# Patient Record
Sex: Male | Born: 1995 | Race: White | Hispanic: No | Marital: Single | Smoking: Current some day smoker
Health system: Southern US, Community
[De-identification: ages and names within clinical notes are randomized; demographics above are authoritative.]

## PROBLEM LIST (undated history)

## (undated) DIAGNOSIS — F909 Attention-deficit hyperactivity disorder, unspecified type: Secondary | ICD-10-CM

## (undated) DIAGNOSIS — S060X9A Concussion with loss of consciousness of unspecified duration, initial encounter: Secondary | ICD-10-CM

## (undated) DIAGNOSIS — S060XAA Concussion with loss of consciousness status unknown, initial encounter: Secondary | ICD-10-CM

## (undated) DIAGNOSIS — B019 Varicella without complication: Secondary | ICD-10-CM

## (undated) DIAGNOSIS — T7840XA Allergy, unspecified, initial encounter: Secondary | ICD-10-CM

## (undated) DIAGNOSIS — J45909 Unspecified asthma, uncomplicated: Secondary | ICD-10-CM

## (undated) HISTORY — DX: Varicella without complication: B01.9

## (undated) HISTORY — DX: Unspecified asthma, uncomplicated: J45.909

## (undated) HISTORY — DX: Allergy, unspecified, initial encounter: T78.40XA

## (undated) HISTORY — DX: Concussion with loss of consciousness of unspecified duration, initial encounter: S06.0X9A

## (undated) HISTORY — DX: Concussion with loss of consciousness status unknown, initial encounter: S06.0XAA

## (undated) HISTORY — PX: MOUTH SURGERY: SHX715

## (undated) HISTORY — PX: FRACTURE SURGERY: SHX138

## (undated) HISTORY — PX: SHOULDER ARTHROSCOPY W/ LABRAL REPAIR: SHX2399

---
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
## 2015-09-21 ENCOUNTER — Ambulatory Visit (INDEPENDENT_AMBULATORY_CARE_PROVIDER_SITE_OTHER): Payer: Worker's Compensation | Admitting: Family Medicine

## 2015-09-21 VITALS — BP 120/78 | HR 87 | Temp 97.9°F | Resp 18 | Ht 71.0 in | Wt 158.6 lb

## 2015-09-21 DIAGNOSIS — S61210A Laceration without foreign body of right index finger without damage to nail, initial encounter: Secondary | ICD-10-CM

## 2015-09-21 DIAGNOSIS — Z23 Encounter for immunization: Secondary | ICD-10-CM

## 2015-09-21 NOTE — Progress Notes (Signed)
   Subjective:  This chart was scribed for Norberto SorensonEva Kamylah Manzo, MD by Stann Oresung-Kai Tsai, Medical Scribe. This patient was seen in Room 5 and the patient's care was started 4:18 PM.   Patient ID: Tyler Berry, male    DOB: 09/01/1996, 19 y.o.   MRN: [id number] Chief Complaint  Patient presents with  . other    cut right finger   HPI Tyler Berry is a 19 y.o. male who [facility] complaining of sudden onset laceration over right index finger by knife edge occurred today around 11:30AM with worker's comp. After the cut, he continued work after wrapping the cut with a band. He believes he is due for tetanus shot.   Allergies  Allergen Reactions  . Sulfa Antibiotics Hives   Review of Systems  Constitutional: Negative for fever, chills, diaphoresis and fatigue.  Respiratory: Negative for cough and shortness of breath.   Gastrointestinal: Negative for nausea, vomiting, diarrhea and constipation.  Skin: Positive for wound (right index finger). Negative for rash.  Neurological: Negative for dizziness and headaches.       Objective:   Physical Exam  Constitutional: He is oriented to person, place, and time. He appears well-developed and well-nourished. No distress.  HENT:  Head: Normocephalic and atraumatic.  Eyes: EOM are normal. Pupils are equal, round, and reactive to light.  Neck: Neck supple.  Cardiovascular: Normal rate.   Pulmonary/Chest: Effort normal. No respiratory distress.  Musculoskeletal: Normal range of motion.  Neurological: He is alert and oriented to person, place, and time.  Skin: Skin is warm and dry.  1cm laceration over dorsum of 2nd finger proximal to PIP  Psychiatric: He has a normal mood and affect. His behavior is normal.  Nursing note and vitals reviewed.   BP 120/78 mmHg  Pulse 87  Temp(Src) 97.9 F (36.6 C) (Oral)  Resp 18  Ht 5\' 11"  (1.803 m)  Wt 158 lb 9.6 oz (71.94 kg)  BMI 22.13 kg/m2  SpO2 98%     Assessment & Plan:   1. Laceration of second  finger, right, initial encounter   Washed well with soap and water, tissue adhesive applied, pt tolerated well.  Advised to avoid getting hands wet freq, do not soak. Wear gloves at work. Can wear fold-over aluminum U-splint to minimize trauma to the laceration to avoid dehiscence.  RTC prn.   Orders Placed This Encounter  Procedures  . Tdap vaccine greater than or equal to 7yo IM    I personally performed the services described in this documentation, which was scribed in my presence. The recorded information has been reviewed and considered, and addended by me as needed.  Norberto SorensonEva Makaya Juneau, MD MPH    By signing my name below, I, Stann Oresung-Kai Tsai, attest that this documentation has been prepared under the direction and in the presence of Norberto SorensonEva Deauna Yaw, MD. Electronically Signed: Stann Oresung-Kai Tsai, Scribe. 09/21/2015 , 4:20 PM .

## 2015-09-21 NOTE — Patient Instructions (Addendum)
Tissue Adhesive Wound Care Some cuts, wounds, lacerations, and incisions can be repaired by using tissue adhesive. Tissue adhesive is like glue. It holds the skin together, allowing for faster healing. It forms a strong bond on the skin in about 1 minute and reaches its full strength in about 2 or 3 minutes. The adhesive disappears naturally while the wound is healing. It is important to take proper care of your wound at home while it heals.  HOME CARE INSTRUCTIONS   Showers are allowed. Do not soak the area containing the tissue adhesive. Do not take baths, swim, or use hot tubs. Do not use any soaps or ointments on the wound. Certain ointments can weaken the glue.  If a bandage (dressing) has been applied, follow your health care provider's instructions for how often to change the dressing.   Keep the dressing dry if one has been applied.   Do not scratch, pick, or rub the adhesive.   Do not place tape over the adhesive. The adhesive could come off when pulling the tape off.   Protect the wound from further injury until it is healed.   Protect the wound from sun and tanning bed exposure while it is healing and for several weeks after healing.   Only take over-the-counter or prescription medicines as directed by your health care provider.   Keep all follow-up appointments as directed by your health care provider. SEEK IMMEDIATE MEDICAL CARE IF:   Your wound becomes red, swollen, hot, or tender.   You develop a rash after the glue is applied.  You have increasing pain in the wound.   You have a red streak that goes away from the wound.   You have pus coming from the wound.   You have increased bleeding.  You have a fever.  You have shaking chills.   You notice a bad smell coming from the wound.   Your wound or adhesive breaks open.  MAKE SURE YOU:   Understand these instructions.  Will watch your condition.  Will get help right away if you are not doing  well or get worse.   This information is not intended to replace advice given to you by your health care provider. Make sure you discuss any questions you have with your health care provider.   Document Released: 03/30/2001 Document Revised: 07/25/2013 Document Reviewed: 04/25/2013 Elsevier Interactive Patient Education 2016 [employer].  Tdap Vaccine (Tetanus, Diphtheria and Pertussis): What You Need to Know 1. Why get vaccinated? Tetanus, diphtheria and pertussis are very serious diseases. Tdap vaccine can protect [place] from these diseases. And, Tdap vaccine given to pregnant women can protect newborn babies against pertussis. TETANUS (Lockjaw) is rare in the [place] today. It causes painful muscle tightening and stiffness, usually all over the body.  It can lead to tightening of muscles in the head and neck so you can't open your mouth, swallow, or sometimes even breathe. Tetanus kills about 1 out of 10 people who are infected even after receiving the best medical care. DIPHTHERIA is also rare in the [place] today. It can cause a thick coating to form in the back of the throat.  It can lead to breathing problems, heart failure, paralysis, and death. PERTUSSIS (Whooping Cough) causes severe coughing spells, which can cause difficulty breathing, vomiting and disturbed sleep.  It can also lead to weight loss, incontinence, and rib fractures. Up to 2 in 100 adolescents and 5 in 100 adults with pertussis are hospitalized or  have complications, which could include pneumonia or death. These diseases are caused by bacteria. Diphtheria and pertussis are spread from person to person through secretions from coughing or sneezing. Tetanus enters the body through cuts, scratches, or wounds. Before vaccines, as many as 200,000 cases of diphtheria, 200,000 cases of pertussis, and hundreds of cases of tetanus, were reported in the [place] each year. Since vaccination began, reports of cases  for tetanus and diphtheria have dropped by about 99% and for pertussis by about 80%. 2. Tdap vaccine Tdap vaccine can protect adolescents and adults from tetanus, diphtheria, and pertussis. One dose of Tdap is routinely given at age 52 or 50. People who did not get Tdap at that age should get it as soon as possible. Tdap is especially important for healthcare professionals and anyone having close contact with a baby younger than 12 months. Pregnant women should get a dose of Tdap during every pregnancy, to protect the newborn from pertussis. Infants are most at risk for severe, life-threatening complications from pertussis. Another vaccine, called Td, protects against tetanus and diphtheria, but not pertussis. A Td booster should be given every 10 years. Tdap may be given as one of these boosters if you have never gotten Tdap before. Tdap may also be given after a severe cut or burn to prevent tetanus infection. Your doctor or the person giving you the vaccine can give you more information. Tdap may safely be given at the same time as other vaccines. 3. Some people should not get this vaccine  A person who has ever had a life-threatening allergic reaction after a previous dose of any diphtheria, tetanus or pertussis containing vaccine, OR has a severe allergy to any part of this vaccine, should not get Tdap vaccine. Tell the person giving the vaccine about any severe allergies.  Anyone who had coma or long repeated seizures within 7 days after a childhood dose of DTP or DTaP, or a previous dose of Tdap, should not get Tdap, unless a cause other than the vaccine was found. They can still get Td.  Talk to your doctor if you:  have seizures or another nervous system problem,  had severe pain or swelling after any vaccine containing diphtheria, tetanus or pertussis,  ever had a condition called Guillain-Barr Syndrome (GBS),  aren't feeling well on the day the shot is scheduled. 4. Risks With any  medicine, including vaccines, there is a chance of side effects. These are usually mild and go away on their own. Serious reactions are also possible but are rare. Most people who get Tdap vaccine do not have any problems with it. Mild problems following Tdap (Did not interfere with activities)  Pain where the shot was given (about 3 in 4 adolescents or 2 in 3 adults)  Redness or swelling where the shot was given (about 1 person in 5)  Mild fever of at least 100.45F (up to about 1 in 25 adolescents or 1 in 100 adults)  Headache (about 3 or 4 people in 10)  Tiredness (about 1 person in 3 or 4)  Nausea, vomiting, diarrhea, stomach ache (up to 1 in 4 adolescents or 1 in 10 adults)  Chills, sore joints (about 1 person in 10)  Body aches (about 1 person in 3 or 4)  Rash, swollen glands (uncommon) Moderate problems following Tdap (Interfered with activities, but did not require medical attention)  Pain where the shot was given (up to 1 in 5 or 6)  Redness or swelling  where the shot was given (up to about 1 in 16 adolescents or 1 in 12 adults)  Fever over 102F (about 1 in 100 adolescents or 1 in 250 adults)  Headache (about 1 in 7 adolescents or 1 in 10 adults)  Nausea, vomiting, diarrhea, stomach ache (up to 1 or 3 people in 100)  Swelling of the entire arm where the shot was given (up to about 1 in 500). Severe problems following Tdap (Unable to perform usual activities; required medical attention)  Swelling, severe pain, bleeding and redness in the arm where the shot was given (rare). Problems that could happen after any vaccine:  People sometimes faint after a medical procedure, including vaccination. Sitting or lying down for about 15 minutes can help prevent fainting, and injuries caused by a fall. Tell your doctor if you feel dizzy, or have vision changes or ringing in the ears.  Some people get severe pain in the shoulder and have difficulty moving the arm where a shot  was given. This happens very rarely.  Any medication can cause a severe allergic reaction. Such reactions from a vaccine are very rare, estimated at fewer than 1 in a million doses, and would happen within a few minutes to a few hours after the vaccination. As with any medicine, there is a very remote chance of a vaccine causing a serious injury or death. The safety of vaccines is always being monitored. For more information, visit: http://floyd.org/www.cdc.gov/vaccinesafety/ 5. What if there is a serious problem? What should I look for?  Look for anything that concerns you, such as signs of a severe allergic reaction, very high fever, or unusual behavior.  Signs of a severe allergic reaction can include hives, swelling of the face and throat, difficulty breathing, a fast heartbeat, dizziness, and weakness. These would usually start a few minutes to a few hours after the vaccination. What should I do?  If you think it is a severe allergic reaction or other emergency that can't wait, call 9-1-1 or get the person to the nearest hospital. Otherwise, call your doctor.  Afterward, the reaction should be reported to the Vaccine Adverse Event Reporting System (VAERS). Your doctor might file this report, or you can do it yourself through the VAERS web site at www.vaers.LAgents.nohhs.gov, or by calling 1-(814)132-9415. VAERS does not give medical advice.  6. The National Vaccine Injury Compensation Program The [employer] Vaccine Injury Compensation Program (VICP) is a federal program that was created to compensate people who may have been injured by certain vaccines. Persons who believe they may have been injured by a vaccine can learn about the program and about filing a claim by calling 1-(747)690-1575 or visiting the VICP website at SpiritualWord.atwww.hrsa.gov/vaccinecompensation. There is a time limit to file a claim for compensation. 7. How can I learn more?  Ask your doctor. He or she can give you the vaccine package insert or suggest other  sources of information.  Call your local or state health department.  Contact the Centers for Disease Control and Prevention (CDC):  Call 940-694-96151-220 667 2115 (1-800-CDC-INFO) or  Visit CDC's website at PicCapture.uywww.cdc.gov/vaccines CDC Tdap Vaccine VIS (12/11/13)   This information is not intended to replace advice given to you by your health care provider. Make sure you discuss any questions you have with your health care provider.   Document Released: 04/04/2012 Document Revised: 10/25/2014 Document Reviewed: 01/16/2014 Elsevier Interactive Patient Education [employer]2016 Elsevier Inc.

## 2016-07-08 ENCOUNTER — Other Ambulatory Visit: Payer: Self-pay | Admitting: Family Medicine

## 2016-07-08 DIAGNOSIS — R229 Localized swelling, mass and lump, unspecified: Principal | ICD-10-CM

## 2016-07-08 DIAGNOSIS — IMO0002 Reserved for concepts with insufficient information to code with codable children: Secondary | ICD-10-CM

## 2016-07-12 ENCOUNTER — Ambulatory Visit
Admission: RE | Admit: 2016-07-12 | Discharge: 2016-07-12 | Disposition: A | Discharge status: Home / Self Care | Payer: BLUE CROSS/BLUE SHIELD | Source: Ambulatory Visit | Attending: Family Medicine | Admitting: Family Medicine

## 2016-07-12 ENCOUNTER — Other Ambulatory Visit: Payer: Self-pay

## 2016-07-12 DIAGNOSIS — IMO0002 Reserved for concepts with insufficient information to code with codable children: Secondary | ICD-10-CM

## 2016-07-12 DIAGNOSIS — R229 Localized swelling, mass and lump, unspecified: Principal | ICD-10-CM

## 2017-02-22 DIAGNOSIS — M545 Low back pain: Secondary | ICD-10-CM | POA: Diagnosis not present

## 2017-03-01 DIAGNOSIS — M545 Low back pain: Secondary | ICD-10-CM | POA: Diagnosis not present

## 2017-03-09 DIAGNOSIS — G8929 Other chronic pain: Secondary | ICD-10-CM | POA: Diagnosis not present

## 2017-03-09 DIAGNOSIS — M545 Low back pain: Secondary | ICD-10-CM | POA: Diagnosis not present

## 2017-03-16 DIAGNOSIS — G8929 Other chronic pain: Secondary | ICD-10-CM | POA: Diagnosis not present

## 2017-03-16 DIAGNOSIS — M545 Low back pain: Secondary | ICD-10-CM | POA: Diagnosis not present

## 2017-03-23 DIAGNOSIS — G8929 Other chronic pain: Secondary | ICD-10-CM | POA: Diagnosis not present

## 2017-03-23 DIAGNOSIS — M545 Low back pain: Secondary | ICD-10-CM | POA: Diagnosis not present

## 2017-03-30 DIAGNOSIS — M545 Low back pain: Secondary | ICD-10-CM | POA: Diagnosis not present

## 2017-03-30 DIAGNOSIS — G8929 Other chronic pain: Secondary | ICD-10-CM | POA: Diagnosis not present

## 2017-04-06 DIAGNOSIS — G8929 Other chronic pain: Secondary | ICD-10-CM | POA: Diagnosis not present

## 2017-04-06 DIAGNOSIS — M545 Low back pain: Secondary | ICD-10-CM | POA: Diagnosis not present

## 2017-06-29 ENCOUNTER — Encounter: Payer: Self-pay | Admitting: Adult Health

## 2017-06-29 ENCOUNTER — Other Ambulatory Visit (HOSPITAL_COMMUNITY)
Admission: RE | Admit: 2017-06-29 | Discharge: 2017-06-29 | Disposition: A | Discharge status: Home / Self Care | Payer: BLUE CROSS/BLUE SHIELD | Source: Ambulatory Visit | Attending: Adult Health | Admitting: Adult Health

## 2017-06-29 ENCOUNTER — Ambulatory Visit (INDEPENDENT_AMBULATORY_CARE_PROVIDER_SITE_OTHER): Payer: BLUE CROSS/BLUE SHIELD | Admitting: Adult Health

## 2017-06-29 VITALS — BP 122/84 | HR 105 | Temp 98.7°F | Ht 71.0 in | Wt 145.6 lb

## 2017-06-29 DIAGNOSIS — Z113 Encounter for screening for infections with a predominantly sexual mode of transmission: Secondary | ICD-10-CM | POA: Diagnosis not present

## 2017-06-29 DIAGNOSIS — Z23 Encounter for immunization: Secondary | ICD-10-CM

## 2017-06-29 DIAGNOSIS — L309 Dermatitis, unspecified: Secondary | ICD-10-CM | POA: Diagnosis not present

## 2017-06-29 MED ORDER — TRIAMCINOLONE ACETONIDE 0.5 % EX OINT: 1.0000 "application " | TOPICAL_OINTMENT | Freq: Two times a day (BID) | CUTANEOUS | 6 refills | Status: DC

## 2017-06-29 NOTE — Progress Notes (Signed)
    Patient presents to clinic today to establish care. He is a pleasant 21 year old male who  has a past medical history of ADHD (attention deficit hyperactivity disorder); Allergy; Asthma; and Concussion.      Acute Concerns: Establish Care / STD screen. He denies any suspicion of STD but he would like to be checked   Chronic Issues: Eczema - has been treated with steroid creams in the past. He has been "breaking out" on his fingers and hands.  Health Maintenance: Dental -- Routine  Vision -- Routine  Immunizations -- UTD  Colonoscopy -- Never had    Past Medical History:  Diagnosis Date  . ADHD (attention deficit hyperactivity disorder)     No past surgical history on file.  No current outpatient prescriptions on file prior to visit.   No current facility-administered medications on file prior to visit.     Allergies  Allergen Reactions  . Sulfa Antibiotics Hives    No family history on file.  Social History   Social History  . Marital status: Single    Spouse name: N/A  . Number of children: N/A  . Years of education: N/A   Occupational History  . Not on file.   Social History Main Topics  . Smoking status: Never Smoker  . Smokeless tobacco: Not on file  . Alcohol use No  . Drug use: Yes    Types: Marijuana  . Sexual activity: Yes   Other Topics Concern  . Not on file   Social History Narrative  . No narrative on file    Review of Systems  Constitutional: Negative.   Respiratory: Negative.   Cardiovascular: Negative.   Genitourinary: Negative.   Neurological: Negative.   All other systems reviewed and are negative.   BP 122/84   Pulse (!) 105   Temp 98.7 F (37.1 C) (Oral)   Ht 5\' 11"  (1.803 m)   Wt 145 lb 9.6 oz (66 kg)   SpO2 96%   BMI 20.31 kg/m   Physical Exam  Constitutional: He is oriented to person, place, and time and well-developed, well-nourished, and in no distress. No distress.  Cardiovascular: Normal rate, regular  rhythm, normal heart sounds and intact distal pulses.  Exam reveals no gallop and no friction rub.   No murmur heard. Pulmonary/Chest: Effort normal and breath sounds normal. No respiratory distress. He has no wheezes. He has no rales. He exhibits no tenderness.  Abdominal: Soft. Bowel sounds are normal. He exhibits no distension and no mass. There is no tenderness. There is no rebound and no guarding.  Musculoskeletal: Normal range of motion. He exhibits no edema, tenderness or deformity.  Neurological: He is alert and oriented to person, place, and time. Gait normal. GCS score is 15.  Skin: Skin is warm and dry. No rash noted. He is not diaphoretic. No erythema. No pallor.  Mild case of eczema on bilateral hands.   Psychiatric: Memory, affect and judgment normal.  Nursing note and vitals reviewed.  Assessment/Plan: 1. Screen for STD (sexually transmitted disease) - HIV antibody - RPR - Urine cytology ancillary only  2. Need for vaccination  - Flu Vaccine QUAD 6+ mos PF IM (Fluarix Quad PF)  3. Eczema, unspecified type  - triamcinolone ointment (KENALOG) 0.5 %; Apply 1 application topically 2 (two) times daily.  Dispense: 30 g; Refill: 6  Follow up in 1-2 years or as needed   Shirline Freesory [place], NP

## 2017-06-29 NOTE — Patient Instructions (Signed)
It was great meeting you today   I will follow up with you regarding your labs.   Follow up with me in 1-2 years or sooner if needed

## 2017-06-30 LAB — RPR: RPR Ser Ql: NONREACTIVE

## 2017-06-30 LAB — HIV ANTIBODY (ROUTINE TESTING W REFLEX): HIV 1&2 Ab, 4th Generation: NONREACTIVE

## 2017-07-01 LAB — URINE CYTOLOGY ANCILLARY ONLY
Chlamydia: NEGATIVE
NEISSERIA GONORRHEA: NEGATIVE
Trichomonas: NEGATIVE

## 2017-07-08 ENCOUNTER — Encounter: Payer: Self-pay | Admitting: Adult Health

## 2018-04-08 IMAGING — US US SCROTUM
1 series · 14 of 25 positions shown · non-contrast
Comparison: None.

CLINICAL DATA: Left-sided palpable lump for 2 weeks.  Painless.

EXAM:
SCROTAL ULTRASOUND
DOPPLER ULTRASOUND OF THE TESTICLES
TECHNIQUE: Complete ultrasound examination of the testicles, epididymis, and
other scrotal structures was performed. Color and spectral Doppler
ultrasound were also utilized to evaluate blood flow to the
testicles.

[Series 1: us scrotum · 14 of 49 slices shown]
[im 1/49]
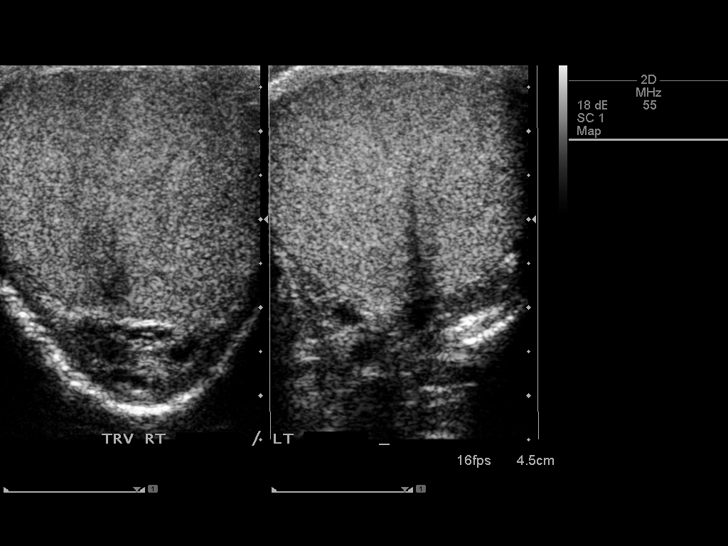
[im 5/49]
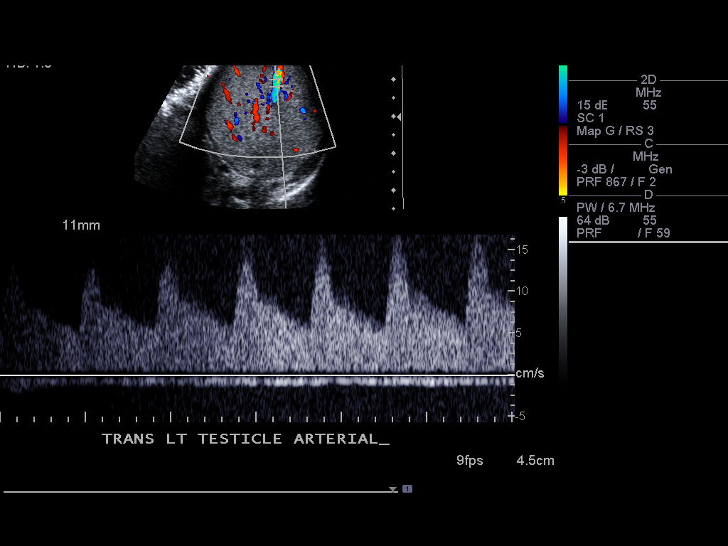
[im 9/49]
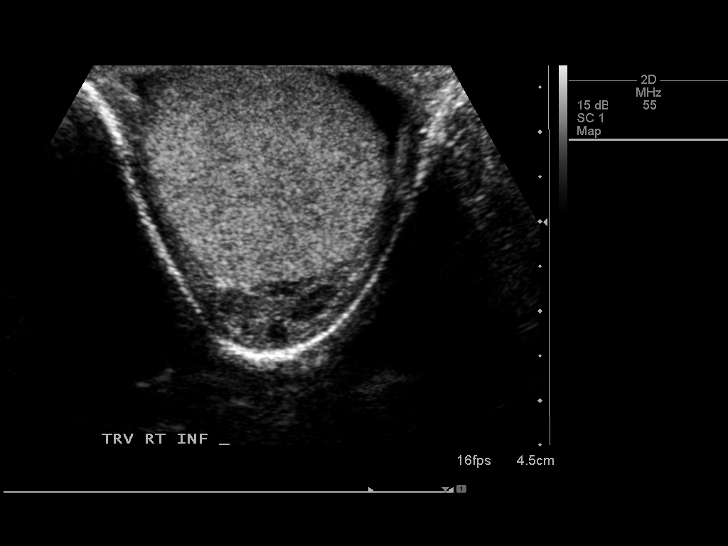
[im 13/49]
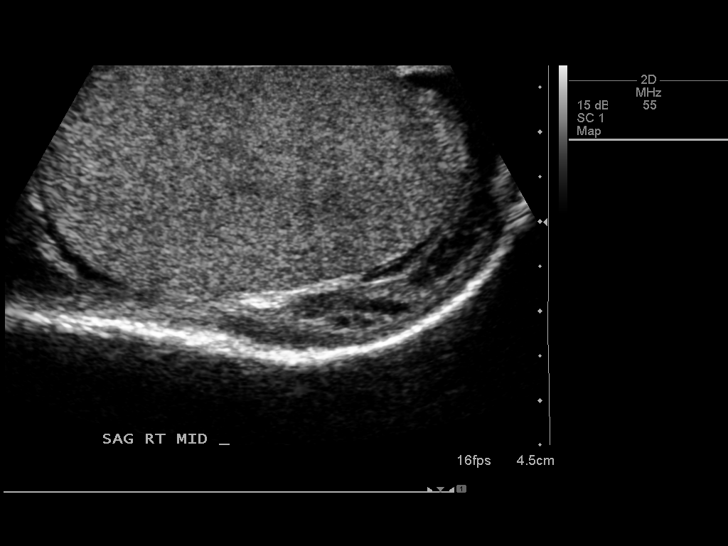
[im 17/49]
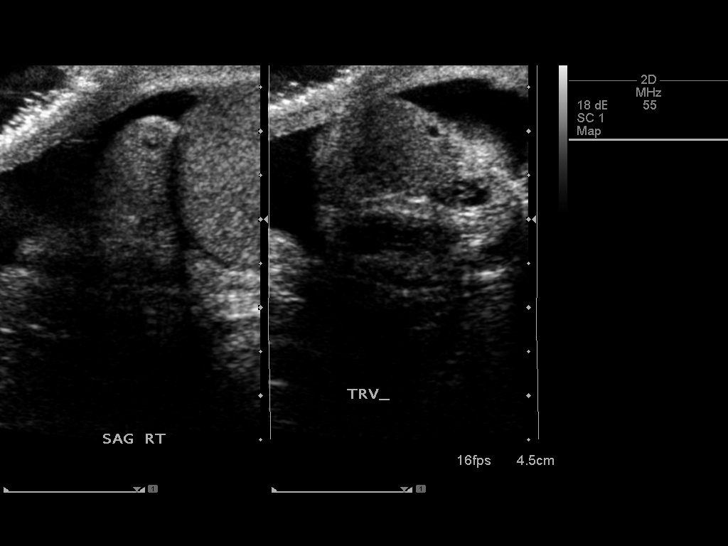
[im 19/49]
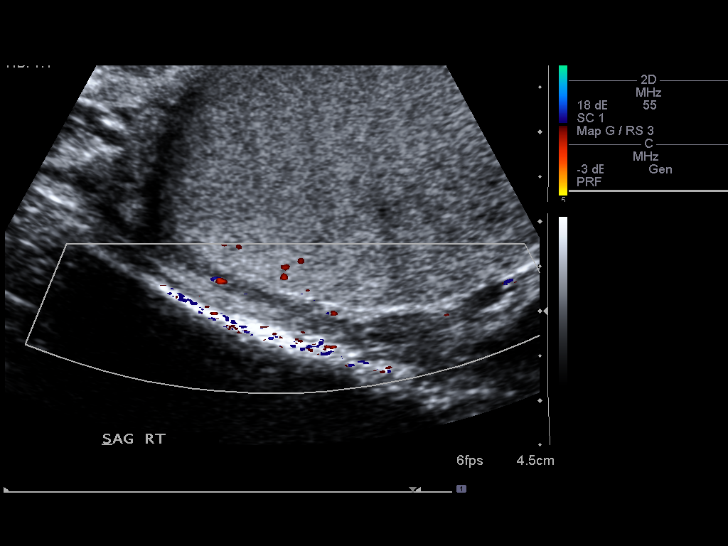
[im 23/49]
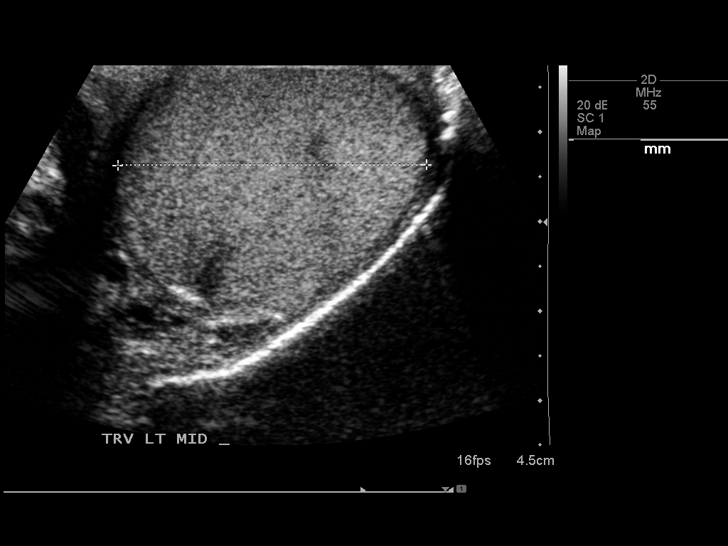
[im 27/49]
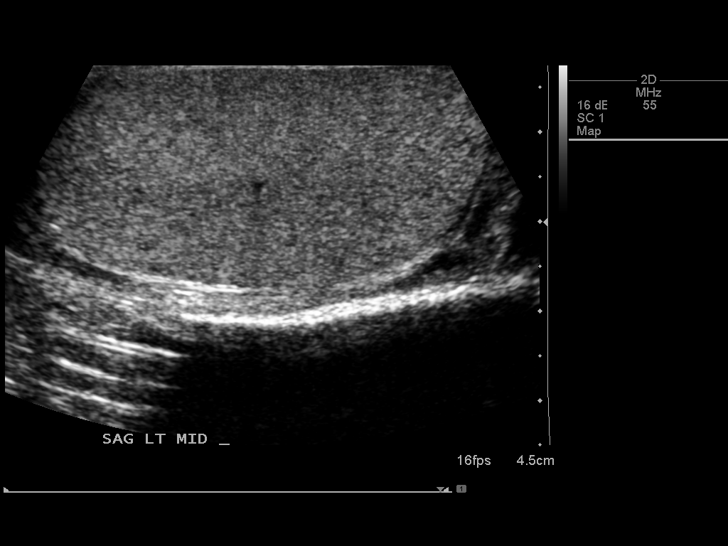
[im 31/49]
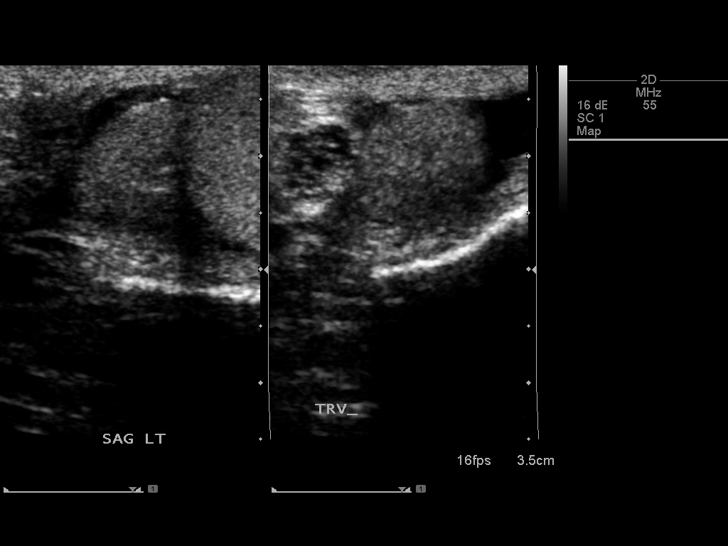
[im 33/49]
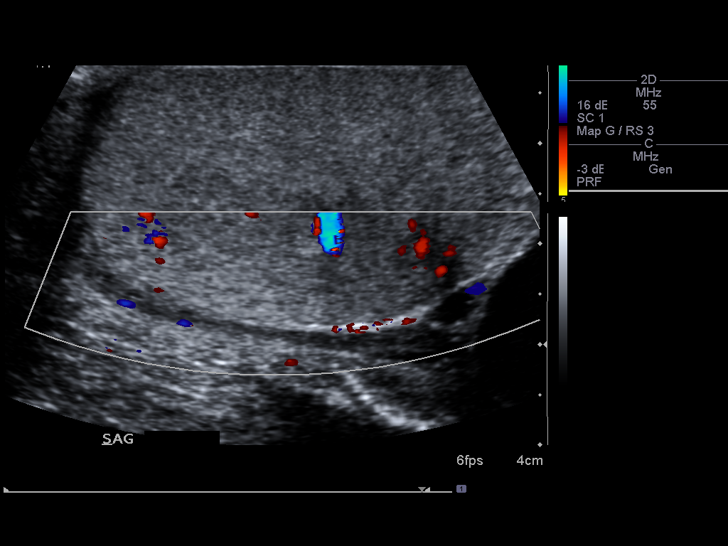
[im 37/49]
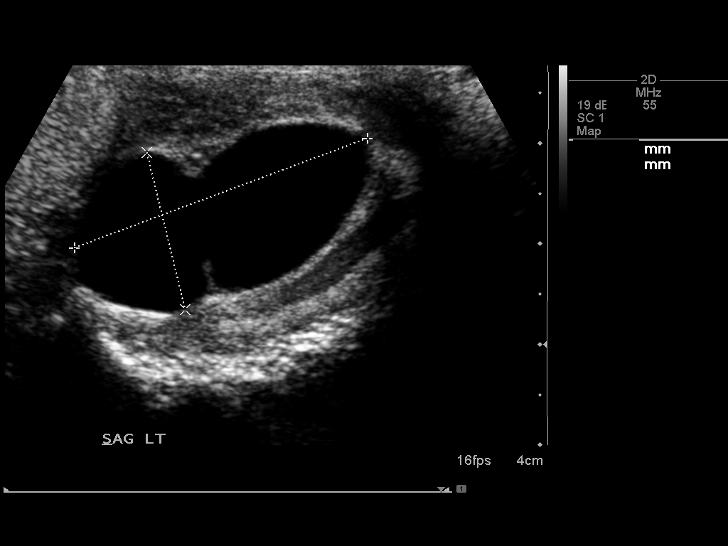
[im 41/49]
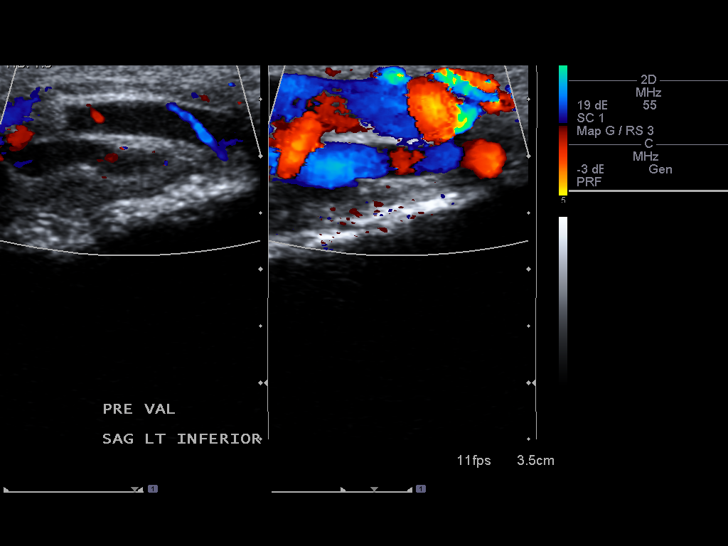
[im 45/49]
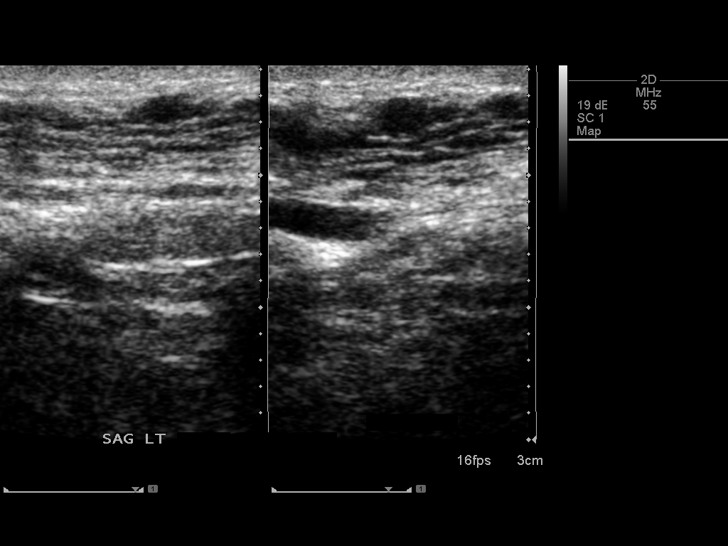
[im 49/49]
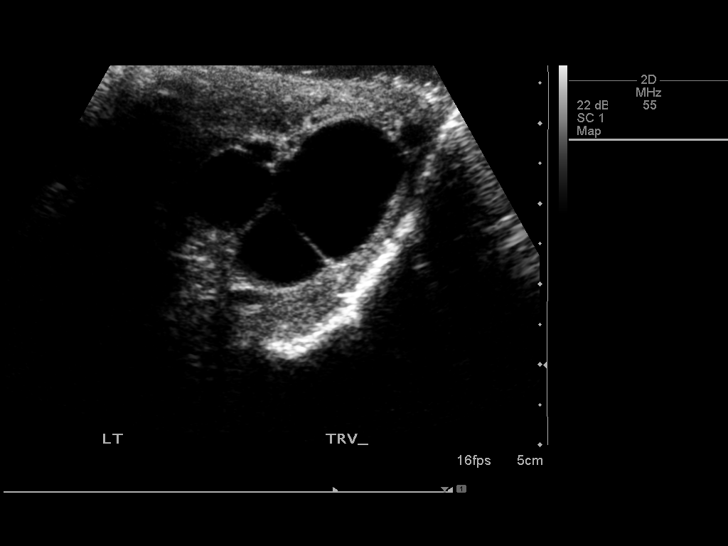

[14 of 25 positions shown; findings below may reference images not displayed]

FINDINGS: Right testicle

Measurements: 4.9 x 2.6 x 3.8 cm.  Normal in morphology.

Left testicle

Measurements:  5.0 x 2.5 x 3.5 cm.  Normal in morphology.

Right epididymis:  Within normal limits.

Left epididymis: An extratesticular simple appearing cystic lesion
is favored to arise from the epididymal tail. This measures 3.1 x
1.6 x 1.6 cm.

Hydrocele:  Absent

Varicocele: Borderline enlargement of left-sided scrotal veins.
Equivocal.

Pulsed Doppler interrogation of both testes demonstrates normal low
resistance arterial and venous waveforms bilaterally.
IMPRESSION: 1. Cystic lesion within the left hemiscrotum is favored to represent
an epididymal cyst versus spermatocele. A benign cyst of the tunica
is felt less likely. No suspicious intratesticular lesion.
2. No evidence of testicular torsion.

## 2018-12-20 ENCOUNTER — Ambulatory Visit: Payer: BLUE CROSS/BLUE SHIELD | Admitting: Adult Health

## 2018-12-20 ENCOUNTER — Encounter: Payer: Self-pay | Admitting: Adult Health

## 2018-12-20 VITALS — BP 114/74 | Temp 97.9°F | Wt 171.0 lb

## 2018-12-20 DIAGNOSIS — Z23 Encounter for immunization: Secondary | ICD-10-CM | POA: Diagnosis not present

## 2018-12-20 DIAGNOSIS — L309 Dermatitis, unspecified: Secondary | ICD-10-CM

## 2018-12-20 DIAGNOSIS — Z7184 Encounter for health counseling related to travel: Secondary | ICD-10-CM

## 2018-12-20 MED ORDER — TRIAMCINOLONE ACETONIDE 0.025 % EX OINT: 1.0000 "application " | TOPICAL_OINTMENT | Freq: Two times a day (BID) | CUTANEOUS | 0 refills | Status: DC

## 2018-12-20 MED ORDER — ATOVAQUONE-PROGUANIL HCL 250-100 MG PO TABS: 1.0000 | ORAL_TABLET | Freq: Every day | ORAL | 0 refills | Status: DC

## 2018-12-20 NOTE — Progress Notes (Signed)
Subjective:    Patient ID: Tyler Berry, male    DOB: [birth year], 23 y.o.   MRN: [id number]  HPI  23 year old male who  has a past medical history of ADHD (attention deficit hyperactivity disorder), Allergy, Asthma, Chicken pox, and Concussion.  He presents to the office today for advice on vaccinations for travel. He will be travel to [place] to see his girlfriends family. He will be going to during the rainy season.   He would also like some steroid cream for Eczema on bilateral hands   Review of Systems See HPI   Past Medical History:  Diagnosis Date  . ADHD (attention deficit hyperactivity disorder)   . Allergy   . Asthma    Childhood  . Chicken pox   . Concussion    x4 football and basketball injuries    Social History   Socioeconomic History  . Marital status: Single    Spouse name: Not on file  . Number of children: Not on file  . Years of education: Not on file  . Highest education level: Not on file  Occupational History  . Not on file  Social Needs  . Financial resource strain: Not on file  . Food insecurity:    Worry: Not on file    Inability: Not on file  . Transportation needs:    Medical: Not on file    Non-medical: Not on file  Tobacco Use  . Smoking status: Never Smoker  . Smokeless tobacco: Never Used  Substance and Sexual Activity  . Alcohol use: No  . Drug use: Yes    Frequency: 1.0 times per week    Types: Marijuana  . Sexual activity: Yes  Lifestyle  . Physical activity:    Days per week: Not on file    Minutes per session: Not on file  . Stress: Not on file  Relationships  . Social connections:    Talks on phone: Not on file    Gets together: Not on file    Attends religious service: Not on file    Active member of club or organization: Not on file    Attends meetings of clubs or organizations: Not on file    Relationship status: Not on file  . Intimate partner violence:    Fear of current or ex partner: Not on  file    Emotionally abused: Not on file    Physically abused: Not on file    Forced sexual activity: Not on file  Other Topics Concern  . Not on file  Social History Narrative   Currently works as Administrator, arts   Not married    No kids     Past Surgical History:  Procedure Laterality Date  . FRACTURE SURGERY     skull, jaw  . MOUTH SURGERY     wisdome teeth extraction  . SHOULDER ARTHROSCOPY W/ LABRAL REPAIR Left     Family History  Problem Relation Age of Onset  . Arthritis Mother   . Anemia Mother   . Arthritis Father     Allergies  Allergen Reactions  . Sulfa Antibiotics Hives    No current outpatient medications on file prior to visit.   No current facility-administered medications on file prior to visit.     BP 114/74   Temp 97.9 F (36.6 C)   Wt 171 lb (77.6 kg)   BMI 23.85 kg/m       Objective:   Physical Exam  Vitals signs and nursing note reviewed.  Constitutional:      Appearance: Normal appearance.  Cardiovascular:     Rate and Rhythm: Normal rate and regular rhythm.     Pulses: Normal pulses.     Heart sounds: Normal heart sounds.  Pulmonary:     Effort: Pulmonary effort is normal.     Breath sounds: Normal breath sounds.  Skin:    General: Skin is warm and dry.     Capillary Refill: Capillary refill takes less than 2 seconds.     Findings: Rash present.     Comments: Eczema rash on bilateral hands and fingers   Neurological:     General: No focal deficit present.       Assessment & Plan:  1. Need for influenza vaccination  - Flu Vaccine QUAD 6+ mos PF IM (Fluarix Quad PF)  2. Eczema, unspecified type  - triamcinolone (KENALOG) 0.025 % ointment; Apply 1 application topically 2 (two) times daily.  Dispense: 30 g; Refill: 0  3. Travel advice encounter - We reviewed vaccinations on CDC website. He has had Hep A/B vaccinations in the past. He has refused Typhoid fever vaccination. Will send in Malarone for malaria prevention.  Advised mosquito bug spray for other mosquito borne illnesses - atovaquone-proguanil (MALARONE) 250-100 MG TABS tablet; Take 1 tablet by mouth daily. Begin  2 days prior to entering a malaria-endemic area, continue throughout the stay and for 7 days after leaving area  Dispense: 26 tablet; Refill: 0  [employer]

## 2021-04-29 ENCOUNTER — Emergency Department (INDEPENDENT_AMBULATORY_CARE_PROVIDER_SITE_OTHER)
Admission: EM | Admit: 2021-04-29 | Discharge: 2021-04-29 | Disposition: A | Discharge status: Home / Self Care | Payer: BC Managed Care – PPO | Source: Home / Self Care | Attending: Family Medicine | Admitting: Family Medicine

## 2021-04-29 ENCOUNTER — Encounter: Payer: Self-pay | Admitting: Emergency Medicine

## 2021-04-29 ENCOUNTER — Emergency Department: Admit: 2021-04-29 | Discharge status: Absent without leave | Payer: Self-pay

## 2021-04-29 ENCOUNTER — Other Ambulatory Visit: Payer: Self-pay

## 2021-04-29 DIAGNOSIS — R5383 Other fatigue: Secondary | ICD-10-CM

## 2021-04-29 NOTE — ED Provider Notes (Signed)
[facility]    CSN: [id number] Arrival date & time: 04/29/21  1808      History   Chief Complaint Chief Complaint  Patient presents with   Fatigue    HPI Tyler Berry is a 25 y.o. male.   HPI  Patient states that he has been suffering from fatigue for the last 2 or 3 days.  He also has a feeling of "foggy" in his thinking and his brain.  He states he feels like he is "floating".  He has some runny nose but states that this is common for him given his chronic allergies.  No sore throat or headache.  No body aches or fever.  No known exposure to COVID.  He did due to COVID test at home that are negative. Patient states his mother has a  form of anemia.  He worries whether he might have this too. Otherwise in good health and on no medicine  Past Medical History:  Diagnosis Date   ADHD (attention deficit hyperactivity disorder)    Allergy    Asthma    Childhood   Chicken pox    Concussion    x4 football and basketball injuries    There are no problems to display for this patient.   Past Surgical History:  Procedure Laterality Date   FRACTURE SURGERY     skull, jaw   MOUTH SURGERY     wisdome teeth extraction   SHOULDER ARTHROSCOPY W/ LABRAL REPAIR Left        Home Medications    Prior to Admission medications   Not on File    Family History Family History  Problem Relation Age of Onset   Arthritis Mother    Anemia Mother    Arthritis Father     Social History Social History   Tobacco Use   Smoking status: Never   Smokeless tobacco: Never  Vaping Use   Vaping Use: Never used  Substance Use Topics   Alcohol use: No   Drug use: Yes    Frequency: 1.0 times per week    Types: Marijuana     Allergies   Sulfa antibiotics   Review of Systems Review of Systems See HPI  Physical Exam Triage Vital Signs ED Triage Vitals  Enc Vitals Group     BP 04/29/21 1836 (!) 146/87     Pulse Rate 04/29/21 1836 93     Resp 04/29/21  1836 16     Temp 04/29/21 1836 99.5 F (37.5 C)     Temp Source 04/29/21 1836 Oral     SpO2 04/29/21 1836 98 %     Weight --      Height --      Head Circumference --      Peak Flow --      Pain Score 04/29/21 1837 3     Pain Loc --      Pain Edu? --      Excl. in GC? --    No data found.  Updated Vital Signs BP (!) 146/87 (BP Location: Right Arm)   Pulse 93   Temp 99.5 F (37.5 C) (Oral)   Resp 16   SpO2 98%      Physical Exam Constitutional:      General: He is not in acute distress.    Appearance: Normal appearance. He is well-developed and normal weight.  HENT:     Head: Normocephalic and atraumatic.     Right Ear: Tympanic membrane  and ear canal normal.     Left Ear: Tympanic membrane and ear canal normal.     Nose: Nose normal. No rhinorrhea.     Mouth/Throat:     Mouth: Mucous membranes are moist.     Pharynx: No posterior oropharyngeal erythema.  Eyes:     Conjunctiva/sclera: Conjunctivae normal.     Pupils: Pupils are equal, round, and reactive to light.  Cardiovascular:     Rate and Rhythm: Normal rate and regular rhythm.     Heart sounds: Normal heart sounds.  Pulmonary:     Effort: Pulmonary effort is normal. No respiratory distress.     Breath sounds: Normal breath sounds. No rhonchi or rales.  Abdominal:     General: There is no distension.     Palpations: Abdomen is soft.  Musculoskeletal:        General: Normal range of motion.     Cervical back: Normal range of motion.  Lymphadenopathy:     Cervical: No cervical adenopathy.  Skin:    General: Skin is warm and dry.  Neurological:     General: No focal deficit present.     Mental Status: He is alert.     Deep Tendon Reflexes: Reflexes normal.  Psychiatric:        Mood and Affect: Mood normal.        Behavior: Behavior normal.     UC Treatments / Results  Labs (all labs ordered are listed, but only abnormal results are displayed) Labs Reviewed  COVID-19, FLU A+B NAA  CBC WITH  DIFFERENTIAL/PLATELET    EKG   Radiology No results found.  Procedures Procedures (including critical care time)  Medications Ordered in UC Medications - No data to display  Initial Impression / Assessment and Plan / UC Course  I have reviewed the triage vital signs and the nursing notes.  Pertinent labs & imaging results that were available during my care of the patient were reviewed by me and considered in my medical decision making (see chart for details).     Fatigue.  Normal exam.  No history of chronic illness.  Negative COVID test. Will repeat his COVID test with influenza We will do a CBC to screen for anemia as See PCP in follow-up Final Clinical Impressions(s) / UC Diagnoses   Final diagnoses:  Fatigue, unspecified type     Discharge Instructions      Try to eat well.  Get plenty of rest.  Drink lots of fluids Your lab test should be available by tomorrow. Home and rest until your symptoms improve. May take Tylenol or ibuprofen for pain or fever Follow-up with your primary care doctor   ED Prescriptions   None    PDMP not reviewed this encounter.   Eustace Moore, MD 04/29/21 [phone]

## 2021-04-29 NOTE — Discharge Instructions (Signed)
Try to eat well.  Get plenty of rest.  Drink lots of fluids Your lab test should be available by tomorrow. Home and rest until your symptoms improve. May take Tylenol or ibuprofen for pain or fever Follow-up with your primary care doctor

## 2021-04-29 NOTE — ED Triage Notes (Signed)
Brain fog & fatigue since Sunday  Woke on Monday w/ diarrhea (x 2 daily) Negative home COVID test on Monday & today  No OTC meds  Denies fever  COVID vaccine (Moderna 2021)

## 2021-04-30 LAB — CBC WITH DIFFERENTIAL/PLATELET
Absolute Monocytes: 585 cells/uL (ref 200–950)
Basophils Absolute: 63 cells/uL (ref 0–200)
Basophils Relative: 0.8 %
Eosinophils Absolute: 269 cells/uL (ref 15–500)
Eosinophils Relative: 3.4 %
HCT: 43.4 % (ref 38.5–50.0)
Hemoglobin: 15.2 g/dL (ref 13.2–17.1)
Lymphs Abs: 2378 cells/uL (ref 850–3900)
MCH: 29.2 pg (ref 27.0–33.0)
MCHC: 35 g/dL (ref 32.0–36.0)
MCV: 83.5 fL (ref 80.0–100.0)
MPV: 10.6 fL (ref 7.5–12.5)
Monocytes Relative: 7.4 %
Neutro Abs: 4606 cells/uL (ref 1500–7800)
Neutrophils Relative %: 58.3 %
Platelets: 281 10*3/uL (ref 140–400)
RBC: 5.2 10*6/uL (ref 4.20–5.80)
RDW: 13 % (ref 11.0–15.0)
Total Lymphocyte: 30.1 %
WBC: 7.9 10*3/uL (ref 3.8–10.8)

## 2021-05-02 LAB — COVID-19, FLU A+B NAA
Influenza A, NAA: NOT DETECTED
Influenza B, NAA: NOT DETECTED
SARS-CoV-2, NAA: NOT DETECTED

## 2021-05-28 ENCOUNTER — Other Ambulatory Visit: Payer: Self-pay

## 2021-05-29 ENCOUNTER — Ambulatory Visit (INDEPENDENT_AMBULATORY_CARE_PROVIDER_SITE_OTHER): Payer: BC Managed Care – PPO | Admitting: Adult Health

## 2021-05-29 ENCOUNTER — Encounter: Payer: Self-pay | Admitting: Adult Health

## 2021-05-29 VITALS — BP 110/88 | HR 88 | Temp 98.5°F | Ht 69.75 in | Wt 211.0 lb

## 2021-05-29 DIAGNOSIS — F32A Depression, unspecified: Secondary | ICD-10-CM

## 2021-05-29 DIAGNOSIS — Z0000 Encounter for general adult medical examination without abnormal findings: Secondary | ICD-10-CM

## 2021-05-29 DIAGNOSIS — F419 Anxiety disorder, unspecified: Secondary | ICD-10-CM

## 2021-05-29 LAB — COMPREHENSIVE METABOLIC PANEL
ALT: 18 U/L (ref 0–53)
AST: 18 U/L (ref 0–37)
Albumin: 4.6 g/dL (ref 3.5–5.2)
Alkaline Phosphatase: 82 U/L (ref 39–117)
BUN: 10 mg/dL (ref 6–23)
CO2: 30 mEq/L (ref 19–32)
Calcium: 9.3 mg/dL (ref 8.4–10.5)
Chloride: 103 mEq/L (ref 96–112)
Creatinine, Ser: 1 mg/dL (ref 0.40–1.50)
GFR: 104.67 mL/min (ref >60.00)
Glucose, Bld: 86 mg/dL (ref 70–99)
Potassium: 4.1 mEq/L (ref 3.5–5.1)
Sodium: 140 mEq/L (ref 135–145)
Total Bilirubin: 0.6 mg/dL (ref 0.2–1.2)
Total Protein: 7.2 g/dL (ref 6.0–8.3)

## 2021-05-29 LAB — CBC WITH DIFFERENTIAL/PLATELET
Basophils Absolute: 0 10*3/uL (ref 0.0–0.1)
Basophils Relative: 0.7 % (ref 0.0–3.0)
Eosinophils Absolute: 0.3 10*3/uL (ref 0.0–0.7)
Eosinophils Relative: 3.9 % (ref 0.0–5.0)
HCT: 42.2 % (ref 39.0–52.0)
Hemoglobin: 14.3 g/dL (ref 13.0–17.0)
Lymphocytes Relative: 31.2 % (ref 12.0–46.0)
Lymphs Abs: 2.1 10*3/uL (ref 0.7–4.0)
MCHC: 33.8 g/dL (ref 30.0–36.0)
MCV: 84.5 fl (ref 78.0–100.0)
Monocytes Absolute: 0.6 10*3/uL (ref 0.1–1.0)
Monocytes Relative: 8.6 % (ref 3.0–12.0)
Neutro Abs: 3.8 10*3/uL (ref 1.4–7.7)
Neutrophils Relative %: 55.6 % (ref 43.0–77.0)
Platelets: 234 10*3/uL (ref 150.0–400.0)
RBC: 5 Mil/uL (ref 4.22–5.81)
RDW: 13.2 % (ref 11.5–15.5)
WBC: 6.7 10*3/uL (ref 4.0–10.5)

## 2021-05-29 LAB — TSH: TSH: 2.18 u[IU]/mL (ref 0.35–5.50)

## 2021-05-29 LAB — LIPID PANEL
Cholesterol: 112 mg/dL (ref 0–200)
HDL: 27.1 mg/dL — ABNORMAL LOW (ref >39.00)
NonHDL: 85.16
Total CHOL/HDL Ratio: 4
Triglycerides: 224 mg/dL — ABNORMAL HIGH (ref 0.0–149.0)
VLDL: 44.8 mg/dL — ABNORMAL HIGH (ref 0.0–40.0)

## 2021-05-29 LAB — LDL CHOLESTEROL, DIRECT: Direct LDL: 70 mg/dL

## 2021-05-29 MED ORDER — BUPROPION HCL ER (XL) 150 MG PO TB24: 150.0000 mg | ORAL_TABLET | Freq: Every day | ORAL | 0 refills | Status: DC

## 2021-05-29 NOTE — Patient Instructions (Addendum)
It was great seeing you today   We will follow up with you regarding your blood work   I have prescribed you wellbutrin to help with the anxiety and depression. Please follow up in 1 month   Someone from behavioral health will call you to schedule

## 2021-05-29 NOTE — Progress Notes (Signed)
Subjective:    Patient ID: Tyler Berry, male    DOB: [birth year], 25 y.o.   MRN: [id number]  HPI  Patient presents for yearly preventative medicine examination. He is a pleasant 25 year old male who  has a past medical history of ADHD (attention deficit hyperactivity disorder), Allergy, Asthma, Chicken pox, and Concussion.  Anxiety and Depression -this is biggest complaint today.  He reports that he has been feeling anxious and depressed as of lately.  He feels as though he has little pleasure in doing things and often feels down and hopeless.  Does not feel like he has a lot of energy.  He denies suicidal ideation but reports that sometimes when he is driving down the road he will often think about what would happen if he had a car accident and killed himself.  He does not have a plan.  No homicidal ideation.  Has never been on any antidepressant/antianxiety medication in the past.  When he was in his early teens his wanted him to see a counselor but he never did.  He is interested in seeing a counselor now to help with his mental health and is also interested in starting medication.  Does have a history of ADHD.  His job is stressful for him and he is burnt out  All immunizations and health maintenance protocols were reviewed with the patient and needed orders were placed.  Appropriate screening laboratory values were ordered for the patient including screening of hyperlipidemia, renal function and hepatic function.   Medication reconciliation,  past medical history, social history, problem list and allergies were reviewed in detail with the patient  Goals were established with regard to weight loss, exercise, and  diet in compliance with medications.  He does exercise most days and tries to eat a heart healthy diet BP Readings from Last 3 Encounters:  05/29/21 110/88  04/29/21 (!) 146/87  12/20/18 114/74      Review of Systems  Constitutional: Negative.   HENT: Negative.     Eyes: Negative.   Respiratory: Negative.    Cardiovascular: Negative.   Gastrointestinal: Negative.   Endocrine: Negative.   Genitourinary: Negative.   Musculoskeletal: Negative.   Skin: Negative.   Allergic/Immunologic: Negative.   Neurological: Negative.   Hematological: Negative.   Psychiatric/Behavioral:  Positive for dysphoric mood and suicidal ideas. The patient is nervous/anxious.   All other systems reviewed and are negative.  Past Medical History:  Diagnosis Date   ADHD (attention deficit hyperactivity disorder)    Allergy    Asthma    Childhood   Chicken pox    Concussion    x4 football and basketball injuries    Social History   Socioeconomic History   Marital status: Single    Spouse name: Not on file   Number of children: Not on file   Years of education: Not on file   Highest education level: Not on file  Occupational History   Not on file  Tobacco Use   Smoking status: Never   Smokeless tobacco: Never  Vaping Use   Vaping Use: Never used  Substance and Sexual Activity   Alcohol use: No   Drug use: Yes    Frequency: 1.0 times per week    Types: Marijuana   Sexual activity: Yes  Other Topics Concern   Not on file  Social History Narrative   Currently works as Administrator, arts   Not married    No kids  Social Determinants of Health   Financial Resource Strain: Not on file  Food Insecurity: Not on file  Transportation Needs: Not on file  Physical Activity: Not on file  Stress: Not on file  Social Connections: Not on file  Intimate Partner Violence: Not on file    Past Surgical History:  Procedure Laterality Date   FRACTURE SURGERY     skull, jaw   MOUTH SURGERY     wisdome teeth extraction   SHOULDER ARTHROSCOPY W/ LABRAL REPAIR Left     Family History  Problem Relation Age of Onset   Arthritis Mother    Anemia Mother    Arthritis Father     Allergies  Allergen Reactions   Sulfa Antibiotics Hives    No current  outpatient medications on file prior to visit.   No current facility-administered medications on file prior to visit.    There were no vitals taken for this visit.       Objective:   Physical Exam Vitals and nursing note reviewed.  Constitutional:      General: He is not in acute distress.    Appearance: Normal appearance. He is well-developed and normal weight.  HENT:     Head: Normocephalic and atraumatic.     Right Ear: Tympanic membrane, ear canal and external ear normal. There is no impacted cerumen.     Left Ear: Tympanic membrane, ear canal and external ear normal. There is no impacted cerumen.     Nose: Nose normal. No congestion or rhinorrhea.     Mouth/Throat:     Mouth: Mucous membranes are moist.     Pharynx: Oropharynx is clear. No oropharyngeal exudate or posterior oropharyngeal erythema.  Eyes:     General:        Right eye: No discharge.        Left eye: No discharge.     Extraocular Movements: Extraocular movements intact.     Conjunctiva/sclera: Conjunctivae normal.     Pupils: Pupils are equal, round, and reactive to light.  Neck:     Vascular: No carotid bruit.     Trachea: No tracheal deviation.  Cardiovascular:     Rate and Rhythm: Normal rate and regular rhythm.     Pulses: Normal pulses.     Heart sounds: Normal heart sounds. No murmur heard.   No friction rub. No gallop.  Pulmonary:     Effort: Pulmonary effort is normal. No respiratory distress.     Breath sounds: Normal breath sounds. No stridor. No wheezing, rhonchi or rales.  Chest:     Chest wall: No tenderness.  Abdominal:     General: Bowel sounds are normal. There is no distension.     Palpations: Abdomen is soft. There is no mass.     Tenderness: There is no abdominal tenderness. There is no right CVA tenderness, left CVA tenderness, guarding or rebound.     Hernia: No hernia is present.  Musculoskeletal:        General: No swelling, tenderness, deformity or signs of injury. Normal  range of motion.     Right lower leg: No edema.     Left lower leg: No edema.  Lymphadenopathy:     Cervical: No cervical adenopathy.  Skin:    General: Skin is warm and dry.     Capillary Refill: Capillary refill takes less than 2 seconds.     Coloration: Skin is not jaundiced or pale.     Findings: No bruising, erythema, lesion  or rash.  Neurological:     General: No focal deficit present.     Mental Status: He is alert and oriented to person, place, and time.     Cranial Nerves: No cranial nerve deficit.     Sensory: No sensory deficit.     Motor: No weakness.     Coordination: Coordination normal.     Gait: Gait normal.     Deep Tendon Reflexes: Reflexes normal.  Psychiatric:        Mood and Affect: Mood normal.        Behavior: Behavior normal.        Thought Content: Thought content normal.        Judgment: Judgment normal.      Assessment & Plan:   1. Routine general medical examination at a health care facility  - CBC with Differential/Platelet; Future - Comprehensive metabolic panel; Future - Lipid panel; Future - TSH; Future - TSH - Lipid panel - Comprehensive metabolic panel - CBC with Differential/Platelet  2. Anxiety and depression  PHQ 9 = 17 -Started on Wellbutrin 150 mg extended release and ready for to psychology.  He will follow-up in 1 month.  Knows to go to the emergency room if he has any suicidal ideation - buPROPion (WELLBUTRIN XL) 150 MG 24 hr tablet; Take 1 tablet (150 mg total) by mouth daily.  Dispense: 30 tablet; Refill: 0 - Ambulatory referral to Psychology; Future   Shirline Frees, NP

## 2021-06-29 ENCOUNTER — Telehealth: Payer: Self-pay | Admitting: Adult Health

## 2021-06-29 NOTE — Telephone Encounter (Signed)
Patient's appt on Sept 15th needs to be r/s.  You can use the virtual slots later that day.

## 2021-07-01 ENCOUNTER — Encounter: Payer: Self-pay | Admitting: Adult Health

## 2021-07-01 ENCOUNTER — Telehealth (INDEPENDENT_AMBULATORY_CARE_PROVIDER_SITE_OTHER): Payer: BC Managed Care – PPO | Admitting: Adult Health

## 2021-07-01 VITALS — Ht 69.5 in | Wt 211.0 lb

## 2021-07-01 DIAGNOSIS — F32A Depression, unspecified: Secondary | ICD-10-CM

## 2021-07-01 DIAGNOSIS — F419 Anxiety disorder, unspecified: Secondary | ICD-10-CM

## 2021-07-01 MED ORDER — BUPROPION HCL ER (XL) 150 MG PO TB24: 150.0000 mg | ORAL_TABLET | Freq: Every day | ORAL | 1 refills | Status: AC

## 2021-07-01 NOTE — Progress Notes (Deleted)
Subjective:    Patient ID: Tyler Berry, male    DOB: 1996-07-02, 25 y.o.   MRN: [id number]  HPI 25 year old male who  has a past medical history of ADHD (attention deficit hyperactivity disorder), Allergy, Asthma, Chicken pox, and Concussion.  He presents to the office today for 1 month follow-up regarding anxiety and depression.  When he was last seen during his physical he reported that he had been feeling more anxious and depressed as of lately.  Felt as though he had little pleasure in doing things and often felt down and hopeless.  He did not feel like he had a lot of energy.  He denied active suicidal ideation but did have passive thoughts from time to time.  He reported that his job is stressful and that he was feeling burned out.  He was ultimately started on Wellbutrin 150 mg extended release was referred to psychology for further evaluation   Review of Systems  Constitutional: Negative.   HENT: Negative.    Eyes: Negative.   Respiratory: Negative.    Cardiovascular: Negative.   Gastrointestinal: Negative.   Endocrine: Negative.   Genitourinary: Negative.   Musculoskeletal: Negative.   Skin: Negative.   Allergic/Immunologic: Negative.   Neurological: Negative.   Hematological: Negative.   Psychiatric/Behavioral: Negative.    All other systems reviewed and are negative.  Past Medical History:  Diagnosis Date   ADHD (attention deficit hyperactivity disorder)    Allergy    Asthma    Childhood   Chicken pox    Concussion    x4 football and basketball injuries    Social History   Socioeconomic History   Marital status: Single    Spouse name: Not on file   Number of children: Not on file   Years of education: Not on file   Highest education level: Not on file  Occupational History   Not on file  Tobacco Use   Smoking status: Never   Smokeless tobacco: Never  Vaping Use   Vaping Use: Never used  Substance and Sexual Activity   Alcohol use: No   Drug  use: Yes    Frequency: 1.0 times per week    Types: Marijuana   Sexual activity: Yes  Other Topics Concern   Not on file  Social History Narrative   Currently works as Administrator, arts   Not married    No kids    Social Determinants of Corporate investment banker Strain: Not on file  Food Insecurity: Not on file  Transportation Needs: Not on file  Physical Activity: Not on file  Stress: Not on file  Social Connections: Not on file  Intimate Partner Violence: Not on file    Past Surgical History:  Procedure Laterality Date   FRACTURE SURGERY     skull, jaw   MOUTH SURGERY     wisdome teeth extraction   SHOULDER ARTHROSCOPY W/ LABRAL REPAIR Left     Family History  Problem Relation Age of Onset   Arthritis Mother    Anemia Mother    Arthritis Father     Allergies  Allergen Reactions   Sulfa Antibiotics Hives    Current Outpatient Medications on File Prior to Visit  Medication Sig Dispense Refill   buPROPion (WELLBUTRIN XL) 150 MG 24 hr tablet Take 1 tablet (150 mg total) by mouth daily. 30 tablet 0   No current facility-administered medications on file prior to visit.    There were no  vitals taken for this visit.       Objective:   Physical Exam Vitals and nursing note reviewed.  Constitutional:      Appearance: Normal appearance.  Cardiovascular:     Rate and Rhythm: Normal rate and regular rhythm.     Pulses: Normal pulses.     Heart sounds: Normal heart sounds.  Pulmonary:     Effort: Pulmonary effort is normal.     Breath sounds: Normal breath sounds.  Musculoskeletal:        General: Normal range of motion.  Skin:    General: Skin is warm and dry.  Neurological:     General: No focal deficit present.     Mental Status: He is alert and oriented to person, place, and time.  Psychiatric:        Mood and Affect: Mood normal.        Behavior: Behavior normal.        Thought Content: Thought content normal.        Judgment: Judgment normal.       Assessment & Plan:

## 2021-07-01 NOTE — Progress Notes (Signed)
Virtual Visit via Video Note  I connected with Woodroe Mode on 07/01/21 at  8:00 AM EDT by a video enabled telemedicine application and verified that I am speaking with the correct person using two identifiers.  Location patient: home Location provider:work or home office Persons participating in the virtual visit: patient, provider  I discussed the limitations of evaluation and management by telemedicine and the availability of in person appointments. The patient expressed understanding and agreed to proceed.   HPI: He presents to the office today for 1 month follow-up regarding anxiety and depression.  When he was last seen during his physical he reported that he had been feeling more anxious and depressed as of lately.  Felt as though he had little pleasure in doing things and often felt down and hopeless.  He did not feel like he had a lot of energy.  He denied active suicidal ideation but did have passive thoughts from time to time.  He reported that his job is stressful and that he was feeling burned out.  He was ultimately started on Wellbutrin 150 mg extended release was referred to psychology for further evaluation  Today he reports that he is feeling less anxious and depressed, his thoughts are more positive and he has had less down days.  Does feel as though his work life has been less stressful.  He has not noticed any side effects such as GI issues or erectile dysfunction since starting Wellbutrin.  He has been contacted by psychiatry and plans on making an appointment for talk therapy.  He would like to continue with Wellbutrin   ROS: See pertinent positives and negatives per HPI.  Past Medical History:  Diagnosis Date   ADHD (attention deficit hyperactivity disorder)    Allergy    Asthma    Childhood   Chicken pox    Concussion    x4 football and basketball injuries    Past Surgical History:  Procedure Laterality Date   FRACTURE SURGERY     skull, jaw   MOUTH SURGERY      wisdome teeth extraction   SHOULDER ARTHROSCOPY W/ LABRAL REPAIR Left     Family History  Problem Relation Age of Onset   Arthritis Mother    Anemia Mother    Arthritis Father        Current Outpatient Medications:    buPROPion (WELLBUTRIN XL) 150 MG 24 hr tablet, Take 1 tablet (150 mg total) by mouth daily., Disp: 30 tablet, Rfl: 0  EXAM:  VITALS per patient if applicable:  GENERAL: alert, oriented, appears well and in no acute distress  HEENT: atraumatic, conjunttiva clear, no obvious abnormalities on inspection of external nose and ears  NECK: normal movements of the head and neck  LUNGS: on inspection no signs of respiratory distress, breathing rate appears normal, no obvious gross SOB, gasping or wheezing  CV: no obvious cyanosis  MS: moves all visible extremities without noticeable abnormality  PSYCH/NEURO: pleasant and cooperative, no obvious depression or anxiety, speech and thought processing grossly intact  ASSESSMENT AND PLAN:  Discussed the following assessment and plan:       I discussed the assessment and treatment plan with the patient. The patient was provided an opportunity to ask questions and all were answered. The patient agreed with the plan and demonstrated an understanding of the instructions.   The patient was advised to call back or seek an in-person evaluation if the symptoms worsen or if the condition fails to improve as  anticipated.   Shirline Frees, NP

## 2022-02-02 ENCOUNTER — Emergency Department (INDEPENDENT_AMBULATORY_CARE_PROVIDER_SITE_OTHER)
Admission: RE | Admit: 2022-02-02 | Discharge: 2022-02-02 | Disposition: A | Discharge status: Home / Self Care | Payer: PRIVATE HEALTH INSURANCE | Source: Ambulatory Visit

## 2022-02-02 VITALS — BP 135/85 | HR 95 | Temp 98.6°F | Resp 15 | Ht 70.0 in | Wt 210.0 lb

## 2022-02-02 DIAGNOSIS — M5431 Sciatica, right side: Secondary | ICD-10-CM | POA: Diagnosis not present

## 2022-02-02 MED ORDER — METHYLPREDNISOLONE SODIUM SUCC 125 MG IJ SOLR
125.0000 mg | Freq: Once | INTRAMUSCULAR | Status: AC
  Administered 2022-02-02: 125 mg via INTRAMUSCULAR

## 2022-02-02 MED ORDER — METHYLPREDNISOLONE 4 MG PO TBPK: ORAL_TABLET | ORAL | 0 refills | Status: AC

## 2022-02-02 MED ORDER — BACLOFEN 10 MG PO TABS: 10.0000 mg | ORAL_TABLET | Freq: Three times a day (TID) | ORAL | 0 refills | Status: AC

## 2022-02-02 NOTE — ED Triage Notes (Signed)
R lower back  pain - radiates down R hip ?Weakness & pain  on standing  ?Pain started yesterday afternoon after bending over  ?Ice  & heat  ?No OTC meds  ? ?

## 2022-02-02 NOTE — ED Provider Notes (Signed)
?[facility] ? ? ? ?CSN: [id number] ?Arrival date & time: 02/02/22  1420 ? ? ?  ? ?History   ?Chief Complaint ?Chief Complaint  ?Patient presents with  ? Back Pain  ?  R lower   ? ? ?HPI ?Maxel Tinklenberg is a 26 y.o. male.  ? ?HPI 26 year old male presents with right-sided lower back pain which radiates to right hip.  Patient reports pain started yesterday afternoon after bending over.  PMH significant for ADHD and asthma. ? ?Past Medical History:  ?Diagnosis Date  ? ADHD (attention deficit hyperactivity disorder)   ? Allergy   ? Asthma   ? Childhood  ? Chicken pox   ? Concussion   ? x4 football and basketball injuries  ? ? ?There are no problems to display for this patient. ? ? ?Past Surgical History:  ?Procedure Laterality Date  ? FRACTURE SURGERY    ? skull, jaw  ? MOUTH SURGERY    ? wisdome teeth extraction  ? SHOULDER ARTHROSCOPY W/ LABRAL REPAIR Left   ? ? ? ? ? ?Home Medications   ? ?Prior to Admission medications   ?Medication Sig Start Date End Date Taking? Authorizing Provider  ?baclofen (LIORESAL) 10 MG tablet Take 1 tablet (10 mg total) by mouth 3 (three) times daily. 02/02/22  Yes Eliezer Lofts, FNP  ?methylPREDNISolone (MEDROL DOSEPAK) 4 MG TBPK tablet Take as directed 02/02/22  Yes Eliezer Lofts, FNP  ?buPROPion (WELLBUTRIN XL) 150 MG 24 hr tablet Take 1 tablet (150 mg total) by mouth daily. ?Patient not taking: Reported on 02/02/2022 07/01/21   Dorothyann Peng, NP  ? ? ?Family History ?Family History  ?Problem Relation Age of Onset  ? Arthritis Mother   ? Anemia Mother   ? Arthritis Father   ? ? ?Social History ?Social History  ? ?Tobacco Use  ? Smoking status: Some Days  ? Smokeless tobacco: Never  ?Vaping Use  ? Vaping Use: Never used  ?Substance Use Topics  ? Alcohol use: No  ? Drug use: Yes  ?  Frequency: 2.0 times per week  ?  Types: Marijuana  ? ? ? ?Allergies   ?Sulfa antibiotics ? ? ?Review of Systems ?Review of Systems  ?Musculoskeletal:  Positive for back pain.  ?All other  systems reviewed and are negative. ? ? ?Physical Exam ?Triage Vital Signs ?ED Triage Vitals  ?Enc Vitals Group  ?   BP 02/02/22 1435 135/85  ?   Pulse Rate 02/02/22 1435 95  ?   Resp 02/02/22 1435 15  ?   Temp 02/02/22 1435 98.6 ?F (37 ?C)  ?   Temp Source 02/02/22 1435 Oral  ?   SpO2 02/02/22 1435 98 %  ?   Weight 02/02/22 1439 210 lb (95.3 kg)  ?   Height 02/02/22 1439 5\' 10"  (1.778 m)  ?   Head Circumference --   ?   Peak Flow --   ?   Pain Score 02/02/22 1437 3  ?   Pain Loc --   ?   Pain Edu? --   ?   Excl. in [place]? --   ? ?No data found. ? ?Updated Vital Signs ?BP 135/85 (BP Location: Left Arm)   Pulse 95   Temp 98.6 ?F (37 ?C) (Oral)   Resp 15   Ht 5\' 10"  (1.778 m)   Wt 210 lb (95.3 kg)   SpO2 98%   BMI 30.13 kg/m?  ? ?  ? ?Physical Exam ?Vitals and nursing note  reviewed.  ?Constitutional:   ?   General: He is not in acute distress. ?   Appearance: Normal appearance. He is normal weight. He is not ill-appearing.  ?HENT:  ?   Head: Normocephalic and atraumatic.  ?   Mouth/Throat:  ?   Mouth: Mucous membranes are moist.  ?   Pharynx: Oropharynx is clear.  ?Eyes:  ?   Extraocular Movements: Extraocular movements intact.  ?   Conjunctiva/sclera: Conjunctivae normal.  ?   Pupils: Pupils are equal, round, and reactive to light.  ?Cardiovascular:  ?   Rate and Rhythm: Normal rate and regular rhythm.  ?   Pulses: Normal pulses.  ?   Heart sounds: Normal heart sounds. No murmur heard. ?Pulmonary:  ?   Effort: Pulmonary effort is normal.  ?   Breath sounds: Normal breath sounds. No wheezing, rhonchi or rales.  ?Musculoskeletal:  ?   Cervical back: Normal range of motion and neck supple.  ?   Comments: Right-sided lumbar sacral spine: TTP over inferior spinal erectors, with palpable muscle adhesions noted  ?Skin: ?   General: Skin is warm and dry.  ?Neurological:  ?   General: No focal deficit present.  ?   Mental Status: He is alert and oriented to person, place, and time.  ? ? ? ?UC Treatments / Results   ?Labs ?(all labs ordered are listed, but only abnormal results are displayed) ?Labs Reviewed - No data to display ? ?EKG ? ? ?Radiology ?No results found. ? ?Procedures ?Procedures (including critical care time) ? ?Medications Ordered in UC ?Medications  ?methylPREDNISolone sodium succinate (SOLU-MEDROL) 125 mg/2 mL injection 125 mg (125 mg Intramuscular Given 02/02/22 1512)  ? ? ?Initial Impression / Assessment and Plan / UC Course  ?I have reviewed the triage vital signs and the nursing notes. ? ?Pertinent labs & imaging results that were available during my care of the patient were reviewed by me and considered in my medical decision making (see chart for details). ? ?  ? ?MDM: 1.  Sciatica of right side-IM Solu-Medrol 125 mg given once in clinic prior to discharge, Rx'd Medrol Dosepak and Baclofen. Instructed patient to take medication as directed with food to completion.  Advised patient to start Medrol Dosepak tomorrow morning, Wednesday, 02/03/2022.  Advised may take Baclofen daily or as needed for accompanying muscle spasms of lower back. Encouraged patient to avoid offending, repetitive motion activities involving affected area of right side of lower back for the next 7 to 10 days.  Advised patient if symptoms worsen and/or unresolved please follow-up with PCP or here for further evaluation. ?Final Clinical Impressions(s) / UC Diagnoses  ? ?Final diagnoses:  ?Sciatica of right side  ? ? ? ?Discharge Instructions   ? ?  ?Instructed patient to take medication as directed with food to completion.  Advised patient to start Medrol Dosepak tomorrow morning, Wednesday, 02/03/2022.  Advised may take Baclofen daily or as needed for accompanying muscle spasms of lower back.  Encouraged patient to avoid offending, repetitive motion activities involving affected area of right side of lower back for the next 7 to 10 days.  Advised patient if symptoms worsen and/or unresolved please follow-up with PCP or here for further  evaluation. ? ? ? ? ?ED Prescriptions   ? ? Medication Sig Dispense Auth. Provider  ? methylPREDNISolone (MEDROL DOSEPAK) 4 MG TBPK tablet Take as directed 1 each Eliezer Lofts, FNP  ? baclofen (LIORESAL) 10 MG tablet Take 1 tablet (10 mg total) by  mouth 3 (three) times daily. 53 each Eliezer Lofts, FNP  ? ?  ? ?PDMP not reviewed this encounter. ?  ?Eliezer Lofts, [place] ?02/02/22 1515 ? ?

## 2022-02-02 NOTE — Discharge Instructions (Addendum)
Instructed patient to take medication as directed with food to completion.  Advised patient to start Medrol Dosepak tomorrow morning, Wednesday, 02/03/2022.  Advised may take Baclofen daily or as needed for accompanying muscle spasms of lower back.  Encouraged patient to avoid offending, repetitive motion activities involving affected area of right side of lower back for the next 7 to 10 days.  Advised patient if symptoms worsen and/or unresolved please follow-up with PCP or here for further evaluation. ?

## 2023-05-17 ENCOUNTER — Telehealth: Payer: PRIVATE HEALTH INSURANCE | Admitting: Family Medicine

## 2023-05-17 DIAGNOSIS — U071 COVID-19: Secondary | ICD-10-CM | POA: Diagnosis not present

## 2023-05-17 MED ORDER — ALBUTEROL SULFATE HFA 108 (90 BASE) MCG/ACT IN AERS: 1.0000 | INHALATION_SPRAY | Freq: Four times a day (QID) | RESPIRATORY_TRACT | 0 refills | Status: AC | PRN

## 2023-05-17 MED ORDER — MOLNUPIRAVIR EUA 200MG CAPSULE: 4.0000 | ORAL_CAPSULE | Freq: Two times a day (BID) | ORAL | 0 refills | Status: AC

## 2023-05-17 NOTE — Progress Notes (Signed)
Virtual Visit Consent   Eian Joachim, you are scheduled for a virtual visit with a [facility] provider today. Just as with appointments in the office, your consent must be obtained to participate. Your consent will be active for this visit and any virtual visit you may have with one of our providers in the next 365 days. If you have a MyChart account, a copy of this consent can be sent to you electronically.  As this is a virtual visit, video technology does not allow for your provider to perform a traditional examination. This may limit your provider's ability to fully assess your condition. If your provider identifies any concerns that need to be evaluated in person or the need to arrange testing (such as labs, EKG, etc.), we will make arrangements to do so. Although advances in technology are sophisticated, we cannot ensure that it will always work on either your end or our end. If the connection with a video visit is poor, the visit may have to be switched to a telephone visit. With either a video or telephone visit, we are not always able to ensure that we have a secure connection.  By engaging in this virtual visit, you consent to the provision of healthcare and authorize for your insurance to be billed (if applicable) for the services provided during this visit. Depending on your insurance coverage, you may receive a charge related to this service.  I need to obtain your verbal consent now. Are you willing to proceed with your visit today? Kealon Boor has provided verbal consent on 05/17/2023 for a virtual visit (video or telephone). Freddy Finner, NP  Date: 05/17/2023 2:58 PM  Virtual Visit via Video Note   I, Freddy Finner, connected with  Kailee Dobmeier [place]  ([id number], 02/15/1996) on 05/17/23 at  3:00 PM EDT by a video-enabled telemedicine application and verified that I am speaking with the correct person using two identifiers.  Location: Patient: Virtual Visit  Location Patient: Home Provider: Virtual Visit Location Provider: Home Office   I discussed the limitations of evaluation and management by telemedicine and the availability of in person appointments. The patient expressed understanding and agreed to proceed.    History of Present Illness: Tyler Berry is a 27 y.o. who identifies as a male who was assigned male at birth, and is being seen today for covid +  Onset was Sunday night - 2 days ago- body aches and  increased fogginess in head Associated symptoms are 100.5 to 100.6 temp, headaches,congestion, fatigued with some shortness of breath, mild cough   Modifying factors are day and nyquil and elderberry cold and flu meds, and tylenol and ibuprofen  Denies chest pain, chills  Exposure to sick contacts- unknown COVID test: HT + (possible first time)  Vaccines: first set no boosters   Problems: There are no problems to display for this patient.   Allergies:  Allergies  Allergen Reactions   Sulfa Antibiotics Hives   Medications:  Current Outpatient Medications:    baclofen (LIORESAL) 10 MG tablet, Take 1 tablet (10 mg total) by mouth 3 (three) times daily., Disp: 30 each, Rfl: 0   buPROPion (WELLBUTRIN XL) 150 MG 24 hr tablet, Take 1 tablet (150 mg total) by mouth daily. (Patient not taking: Reported on 02/02/2022), Disp: 90 tablet, Rfl: 1   methylPREDNISolone (MEDROL DOSEPAK) 4 MG TBPK tablet, Take as directed, Disp: 1 each, Rfl: 0  Observations/Objective: Patient is well-developed, well-nourished in no acute distress.  Resting comfortably  at home.  Head is normocephalic, atraumatic.  No labored breathing.  Speech is clear and coherent with logical content.  Patient is alert and oriented at baseline.    Assessment and Plan:  1. COVID-19  - molnupiravir EUA (LAGEVRIO) 200 mg CAPS capsule; Take 4 capsules (800 mg total) by mouth 2 (two) times daily for 5 days.  Dispense: 40 capsule; Refill: 0 - albuterol (VENTOLIN  HFA) 108 (90 Base) MCG/ACT inhaler; Inhale 1-2 puffs into the lungs every 6 (six) hours as needed for wheezing or shortness of breath.  Dispense: 8 g; Refill: 0  - Continue OTC symptomatic management of choice - Info for COVID sent on AVS as well - Take prescribed medications as directed - Push fluids - Rest as needed - Discussed return precautions and when to seek in-person evaluation, sent via AVS as well  Reviewed side effects, risks and benefits of medication.    Patient acknowledged agreement and understanding of the plan.   Past Medical, Surgical, Social History, Allergies, and Medications have been Reviewed.     Follow Up Instructions: I discussed the assessment and treatment plan with the patient. The patient was provided an opportunity to ask questions and all were answered. The patient agreed with the plan and demonstrated an understanding of the instructions.  A copy of instructions were sent to the patient via MyChart unless otherwise noted below.     The patient was advised to call back or seek an in-person evaluation if the symptoms worsen or if the condition fails to improve as anticipated.  Time:  I spent 10 minutes with the patient via telehealth technology discussing the above problems/concerns.    Freddy Finner, NP

## 2023-05-17 NOTE — Patient Instructions (Signed)
Mayer Masker, thank you for joining Freddy Finner, NP for today's virtual visit.  While this provider is not your primary care provider (PCP), if your PCP is located in our provider database this encounter information will be shared with them immediately following your visit.   A [facility] MyChart account gives you access to today's visit and all your visits, tests, and labs performed at [facility] " click here if you don't have a [facility] MyChart account or go to [email or web address]  Consent: (Patient) Tyler Berry provided verbal consent for this virtual visit at the beginning of the encounter.  Current Medications:  Current Outpatient Medications:    albuterol (VENTOLIN HFA) 108 (90 Base) MCG/ACT inhaler, Inhale 1-2 puffs into the lungs every 6 (six) hours as needed for wheezing or shortness of breath., Disp: 8 g, Rfl: 0   baclofen (LIORESAL) 10 MG tablet, Take 1 tablet (10 mg total) by mouth 3 (three) times daily., Disp: 30 each, Rfl: 0   buPROPion (WELLBUTRIN XL) 150 MG 24 hr tablet, Take 1 tablet (150 mg total) by mouth daily. (Patient not taking: Reported on 02/02/2022), Disp: 90 tablet, Rfl: 1   methylPREDNISolone (MEDROL DOSEPAK) 4 MG TBPK tablet, Take as directed, Disp: 1 each, Rfl: 0   molnupiravir EUA (LAGEVRIO) 200 mg CAPS capsule, Take 4 capsules (800 mg total) by mouth 2 (two) times daily for 5 days., Disp: 40 capsule, Rfl: 0   Medications ordered in this encounter:  Meds ordered this encounter  Medications   molnupiravir EUA (LAGEVRIO) 200 mg CAPS capsule    Sig: Take 4 capsules (800 mg total) by mouth 2 (two) times daily for 5 days.    Dispense:  40 capsule    Refill:  0    Order Specific Question:   Supervising Provider    Answer:   Loreli Dollar   albuterol (VENTOLIN HFA) 108 (90 Base) MCG/ACT inhaler    Sig: Inhale 1-2 puffs into the lungs every 6 (six) hours as needed for wheezing or shortness of breath.     Dispense:  8 g    Refill:  0    Order Specific Question:   Supervising Provider    Answer:   Merrilee Jansky [id number]     *If you need refills on other medications prior to your next appointment, please contact your pharmacy*  Follow-Up: Call back or seek an in-person evaluation if the symptoms worsen or if the condition fails to improve as anticipated.  [facility] [phone]  Care Instructions:   Take medications as instructed  Hydrate  Rest  Use OTC as needed    Isolation Instructions: You are to isolate at home until you have been fever free for at least 24 hours without a fever-reducing medication, and symptoms have been steadily improving for 24 hours. At that time,  you can end isolation but need to mask for an additional 5 days.   If you must be around other household members who do not have symptoms, you need to make sure that both you and the family members are masking consistently with a high-quality mask.  If you note any worsening of symptoms despite treatment, please seek an in-person evaluation ASAP. If you note any significant shortness of breath or any chest pain, please seek ER evaluation. Please do not delay care!   COVID-19: What to Do if You Are Sick If you test positive and are an older adult or someone who  is at high risk of getting very sick from COVID-19, treatment may be available. Contact a healthcare provider right away after a positive test to determine if you are eligible, even if your symptoms are mild right now. You can also visit a Test to Treat location and, if eligible, receive a prescription from a provider. Don't delay: Treatment must be started within the first few days to be effective. If you have a fever, cough, or other symptoms, you might have COVID-19. Most people have mild illness and are able to recover at home. If you are sick: Keep track of your symptoms. If you have an emergency warning sign (including trouble  breathing), call 911. Steps to help prevent the spread of COVID-19 if you are sick If you are sick with COVID-19 or think you might have COVID-19, follow the steps below to care for yourself and to help protect other people in your home and community. Stay home except to get medical care Stay home. Most people with COVID-19 have mild illness and can recover at home without medical care. Do not leave your home, except to get medical care. Do not visit public areas and do not go to places where you are unable to wear a mask. Take care of yourself. Get rest and stay hydrated. Take over-the-counter medicines, such as acetaminophen, to help you feel better. Stay in touch with your doctor. Call before you get medical care. Be sure to get care if you have trouble breathing, or have any other emergency warning signs, or if you think it is an emergency. Avoid public transportation, ride-sharing, or taxis if possible. Get tested If you have symptoms of COVID-19, get tested. While waiting for test results, stay away from others, including staying apart from those living in your household. Get tested as soon as possible after your symptoms start. Treatments may be available for people with COVID-19 who are at risk for becoming very sick. Don't delay: Treatment must be started early to be effective--some treatments must begin within 5 days of your first symptoms. Contact your healthcare provider right away if your test result is positive to determine if you are eligible. Self-tests are one of several options for testing for the virus that causes COVID-19 and may be more convenient than laboratory-based tests and point-of-care tests. Ask your healthcare provider or your local health department if you need help interpreting your test results. You can visit your state, tribal, local, and territorial health department's website to look for the latest local information on testing sites. Separate yourself from other  people As much as possible, stay in a specific room and away from other people and pets in your home. If possible, you should use a separate bathroom. If you need to be around other people or animals in or outside of the home, wear a well-fitting mask. Tell your close contacts that they may have been exposed to COVID-19. An infected person can spread COVID-19 starting 48 hours (or 2 days) before the person has any symptoms or tests positive. By letting your close contacts know they may have been exposed to COVID-19, you are helping to protect everyone. See COVID-19 and Animals if you have questions about pets. If you are diagnosed with COVID-19, someone from the health department may call you. Answer the call to slow the spread. Monitor your symptoms Symptoms of COVID-19 include fever, cough, or other symptoms. Follow care instructions from your healthcare provider and local health department. Your local health authorities may give instructions  on checking your symptoms and reporting information. When to seek emergency medical attention Look for emergency warning signs* for COVID-19. If someone is showing any of these signs, seek emergency medical care immediately: Trouble breathing Persistent pain or pressure in the chest New confusion Inability to wake or stay awake Pale, gray, or blue-colored skin, lips, or nail beds, depending on skin tone *This list is not all possible symptoms. Please call your medical provider for any other symptoms that are severe or concerning to you. Call 911 or call ahead to your local emergency facility: Notify the operator that you are seeking care for someone who has or may have COVID-19. Call ahead before visiting your doctor Call ahead. Many medical visits for routine care are being postponed or done by phone or telemedicine. If you have a medical appointment that cannot be postponed, call your doctor's office, and tell them you have or may have COVID-19. This will  help the office protect themselves and other patients. If you are sick, wear a well-fitting mask You should wear a mask if you must be around other people or animals, including pets (even at home). Wear a mask with the best fit, protection, and comfort for you. You don't need to wear the mask if you are alone. If you can't put on a mask (because of trouble breathing, for example), cover your coughs and sneezes in some other way. Try to stay at least 6 feet away from other people. This will help protect the people around you. Masks should not be placed on young children under age 56 years, anyone who has trouble breathing, or anyone who is not able to remove the mask without help. Cover your coughs and sneezes Cover your mouth and nose with a tissue when you cough or sneeze. Throw away used tissues in a lined trash can. Immediately wash your hands with soap and water for at least 20 seconds. If soap and water are not available, clean your hands with an alcohol-based hand sanitizer that contains at least 60% alcohol. Clean your hands often Wash your hands often with soap and water for at least 20 seconds. This is especially important after blowing your nose, coughing, or sneezing; going to the bathroom; and before eating or preparing food. Use hand sanitizer if soap and water are not available. Use an alcohol-based hand sanitizer with at least 60% alcohol, covering all surfaces of your hands and rubbing them together until they feel dry. Soap and water are the best option, especially if hands are visibly dirty. Avoid touching your eyes, nose, and mouth with unwashed hands. Handwashing Tips Avoid sharing personal household items Do not share dishes, drinking glasses, cups, eating utensils, towels, or bedding with other people in your home. Wash these items thoroughly after using them with soap and water or put in the dishwasher. Clean surfaces in your home regularly Clean and disinfect high-touch  surfaces (for example, doorknobs, tables, handles, light switches, and countertops) in your "sick room" and bathroom. In shared spaces, you should clean and disinfect surfaces and items after each use by the person who is ill. If you are sick and cannot clean, a caregiver or other person should only clean and disinfect the area around you (such as your bedroom and bathroom) on an as needed basis. Your caregiver/other person should wait as long as possible (at least several hours) and wear a mask before entering, cleaning, and disinfecting shared spaces that you use. Clean and disinfect areas that may have blood,  stool, or body fluids on them. Use household cleaners and disinfectants. Clean visible dirty surfaces with household cleaners containing soap or detergent. Then, use a household disinfectant. Use a product from [employer] List N: Disinfectants for Coronavirus (COVID-19). Be sure to follow the instructions on the label to ensure safe and effective use of the product. Many products recommend keeping the surface wet with a disinfectant for a certain period of time (look at "contact time" on the product label). You may also need to wear personal protective equipment, such as gloves, depending on the directions on the product label. Immediately after disinfecting, wash your hands with soap and water for 20 seconds. For completed guidance on cleaning and disinfecting your home, visit Complete Disinfection Guidance. Take steps to improve ventilation at home Improve ventilation (air flow) at home to help prevent from spreading COVID-19 to other people in your household. Clear out COVID-19 virus particles in the air by opening windows, using air filters, and turning on fans in your home. Use this interactive tool to learn how to improve air flow in your home. When you can be around others after being sick with COVID-19 Deciding when you can be around others is different for different situations. Find out  when you can safely end home isolation. For any additional questions about your care, contact your healthcare provider or state or local health department. 01/06/2021 Content source: [facility] ([facility]), Division of Viral Diseases This information is not intended to replace advice given to you by your health care provider. Make sure you discuss any questions you have with your health care provider. Document Revised: 02/19/2021 Document Reviewed: 02/19/2021 Elsevier Patient Education  2022 [employer].     If you have been instructed to have an in-person evaluation today at a local Urgent Care facility, please use the link below. It will take you to a list of all of our available [facility], including address, phone number and hours of operation. Please do not delay care.  [facility]  If you or a family member do not have a primary care provider, use the link below to schedule a visit and establish care. When you choose a [facility] primary care physician or advanced practice provider, you gain a long-term partner in health. Find a Primary Care Provider  Learn more about [facility]'s in-office and virtual care options: [facility] - Get Care Now

## 2023-11-08 ENCOUNTER — Telehealth: Payer: Self-pay | Admitting: Adult Health

## 2023-11-08 NOTE — Telephone Encounter (Signed)
Lmom for pt to sch cpe or ov it has been over 1 yr
